# Patient Record
Sex: Female | Born: 2005 | Race: White | Hispanic: No | Marital: Single
Health system: Southern US, Community
[De-identification: ages and names within clinical notes are randomized; demographics above are authoritative.]

---
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
## 2024-01-08 ENCOUNTER — Ambulatory Visit: Payer: Self-pay | Admitting: Psychology

## 2024-01-20 ENCOUNTER — Ambulatory Visit (INDEPENDENT_AMBULATORY_CARE_PROVIDER_SITE_OTHER): Payer: Self-pay | Admitting: Psychology

## 2024-01-20 DIAGNOSIS — F4010 Social phobia, unspecified: Secondary | ICD-10-CM

## 2024-01-20 NOTE — Progress Notes (Signed)
 [facility] Counselor/Therapist Progress Note  Patient ID: Ree, Alcalde MRN: [id number]    Date: 01/20/2024  Time Spent: 8:02am- 8:48am   Treatment Type: Individual Therapy  pt is seen for a virtual video visit via caregility.  Pt joins from his home and counselor from her office.  Pt consents to virtual visit and is aware of limitations of such visits.   Reported Symptoms: Pt reported enjoying interactions w/ friends. Pt reports anxiety about change and leaving friends.  Mental Status Exam: Appearance:  Well Groomed     Behavior: Appropriate  Motor: Normal  Speech/Language:  Normal Rate  Affect: Appropriate  Mood: anxious  Thought process: normal  Thought content:   WNL  Sensory/Perceptual disturbances:   WNL  Orientation: oriented to person, place, time/date, and situation  Attention: Good  Concentration: Good  Memory: WNL  Fund of knowledge:  Good  Insight:   Good  Judgment:  Good  Impulse Control: Good   Risk Assessment: Danger to Self:  No Self-injurious Behavior: No Danger to Others: No Duty to Warn:no Physical Aggression / Violence:No  Access to Firearms a concern: No  Gang Involvement:No   Subjective: counselor assessed pt current functioning per pt report. Processed w/ pt positives, stressors and anxiety.  Explored guilt feelings re: past interactions w/ friend and their hurt feelings. Discussed anxiety re; graduation and change and assisted w/ reframing distortions.  pt affect wnl. Pt discussed some struggles w guild and anxiety about future changes.   Pt was able to acknowledge intentions weren't to hurt and that boundaries set for positive intentions.  Pt discussed anxiety about graduated and going to college and "starting over".  Pt discussed comfort w/ friends established in hight school.  Pt able to challenge and reframe w/ counselor assistance.     Interventions: Cognitive Behavioral Therapy, Psychologist, occupational, and  Solution-Oriented/Positive Psychology  Diagnosis:Social anxiety disorder  Plan: Pt to f/u w/ counseling in 2 weeks to assist coping w/ anxiety.    Individualized Treatment Plan Strengths: enjoys art and music.  Self taught guitar.  Enjoys hanging w/ friends.  Graduating high school this year and planning for college.    Supports: 2 cousins, bestfriend- [place] and [place], and parents.      Goal/Needs for Treatment:  In order of importance to patient 1) increase self confidence/assertiveness 2) decreased anxiety 3) cope w/ transition post high school    Client Statement of Needs: Pt: " I want to continue working on anxiety,  speak up for who I want to be and what I want in the future, I want to be able to tell people when I don't like what they are doing/ when misgender.  I need to think for myself more and not for someone else's opinion. Increase confidence in self"  Pt also wanting support through transition of graduating and starting college.            Treatment Level:outpatient counseling  Symptoms:social anxiety, low self confidence, difficulty being assertive.  Client Treatment Preferences:biweekly counseling.  Prefers in person when family schedule allows.      Healthcare consumer's goal for treatment:   Counselor, Forde Radon, [facility] will support the patient's ability to achieve the goals identified. Cognitive Behavioral Therapy, Assertive Communication/Conflict Resolution Training, Relaxation Training, ACT, Humanistic and other evidenced-based practices will be used to promote progress towards healthy functioning.    Healthcare consumer will: Actively participate in therapy, working towards healthy functioning.     *  Justification for Continuation/Discontinuation of Goal: R=Revised, O=Ongoing, A=Achieved, D=Discontinued   Goal 1) Build a consistently positive self-image- being able to identify and verbalize own thoughts/feelings, increase encouraging self and increased assertive  communication per pt report and therapist observation. Baseline date 09/18/23: Progress towards goal 40; How Often - Daily Target Date Goal Was reviewed Status Code Progress towards goal/Likert rating  09/17/24                            Goal 2) Decrease anxiety/shyness and fear of judgement in social settings/interactions through use of relaxation skills and challenging distortions AEB pt report and therapist observation.  Baseline date 09/18/23: Progress towards goal 60; How Often - Daily Target Date Goal Was reviewed Status Code Progress towards goal  09/17/24                            Goal 3) Increase pt confidence in transition from high school to post graduation by exploring options, identifying plan and implementing per pt report and therapist observation..   Baseline date: 09/18/23  Progress towards goal 0; How Often - Daily Target Date Goal Was reviewed Status Code Progress towards goal  09/17/24                            This plan has been reviewed and created by the following participants:  This plan will be reviewed at least every 12 months. Date Behavioral Health Clinician Date Guardian/Patient   09/18/23 [facility] Ophelia Charter [facility] 09/18/23 Verbal Consent Provided                                    Forde Radon [facility]

## 2024-02-17 ENCOUNTER — Ambulatory Visit: Payer: Self-pay | Admitting: Psychology

## 2024-02-17 DIAGNOSIS — F4010 Social phobia, unspecified: Secondary | ICD-10-CM | POA: Diagnosis not present

## 2024-02-17 NOTE — Progress Notes (Signed)
 [facility] Counselor/Therapist Progress Note  Patient ID: Pamela Lester, Chinn MRN: [id number]    Date: 02/17/2024  Time Spent: 8:02am- 8:48am   Treatment Type: Individual Therapy  pt is seen for a virtual video visit via caregility.  Pt joins from his home and counselor from her home office.  Pt consents to virtual visit and is aware of limitations of such visits.   Reported Symptoms: Pt reported enjoying interactions w/ friends. Pt reports anxious about sharing that reconnected w/ ex  Mental Status Exam: Appearance:  Well Groomed     Behavior: Appropriate and slightly guarded  Motor: Normal  Speech/Language:  Normal Rate  Affect: Appropriate  Mood: anxious  Thought process: normal  Thought content:   WNL  Sensory/Perceptual disturbances:   WNL  Orientation: oriented to person, place, time/date, and situation  Attention: Good  Concentration: Good  Memory: WNL  Fund of knowledge:  Good  Insight:   Good  Judgment:  Good  Impulse Control: Good   Risk Assessment: Danger to Self:  No Self-injurious Behavior: No Danger to Others: No Duty to Warn:no Physical Aggression / Violence:No  Access to Firearms a concern: No  Gang Involvement:No   Subjective: counselor assessed pt current functioning per pt report. Processed w/ pt positives, stressors and worries. Explored feelings w/ friend leaving work and how can feel sadness and also experience other emotions.  Discussed reconnecting w/ ex and awareness of past patterns and what is signs of healthy vs. Unhealthy relationship- what boundaries to keep.   pt affect congruent w/ emotions.  Pt slightly guarded.  Pt reports that she is enjoying time w/ friends and engaging w/ friends at work and school.  Pt reports sadness that close friend just left work.  Pt recognized that other thing still positive about work and connections and can maintain contact in other ways.  Pt was anxious to share and disclose about  talking ot ex again for a week.  Pt reports that aware of unhealthy relationship/patterns and why loved ones would be concerned.  Pt doesn't want to become codependent in relationship again and will consider boundaries needs to maintain.    Interventions: Cognitive Behavioral Therapy, Psychologist, occupational, and Solution-Oriented/Positive Psychology  Diagnosis:Social anxiety disorder  Plan: Pt to f/u w/ counseling in 2 weeks to assist coping w/ anxiety.    Individualized Treatment Plan Strengths: enjoys art and music.  Self taught guitar.  Enjoys hanging w/ friends.  Graduating high school this year and planning for college.    Supports: 2 cousins, bestfriend- [place] and [place], and parents.      Goal/Needs for Treatment:  In order of importance to patient 1) increase self confidence/assertiveness 2) decreased anxiety 3) cope w/ transition post high school    Client Statement of Needs: Pt: " I want to continue working on anxiety,  speak up for who I want to be and what I want in the future, I want to be able to tell people when I don't like what they are doing/ when misgender.  I need to think for myself more and not for someone else's opinion. Increase confidence in self"  Pt also wanting support through transition of graduating and starting college.            Treatment Level:outpatient counseling  Symptoms:social anxiety, low self confidence, difficulty being assertive.  Client Treatment Preferences:biweekly counseling.  Prefers in person when family schedule allows.      Healthcare consumer's goal for  treatment:   Counselor, Forde Radon, [facility] will support the patient's ability to achieve the goals identified. Cognitive Behavioral Therapy, Assertive Communication/Conflict Resolution Training, Relaxation Training, ACT, Humanistic and other evidenced-based practices will be used to promote progress towards healthy functioning.    Healthcare consumer will: Actively participate in  therapy, working towards healthy functioning.     *Justification for Continuation/Discontinuation of Goal: R=Revised, O=Ongoing, A=Achieved, D=Discontinued   Goal 1) Build a consistently positive self-image- being able to identify and verbalize own thoughts/feelings, increase encouraging self and increased assertive communication per pt report and therapist observation. Baseline date 09/18/23: Progress towards goal 40; How Often - Daily Target Date Goal Was reviewed Status Code Progress towards goal/Likert rating  09/17/24                            Goal 2) Decrease anxiety/shyness and fear of judgement in social settings/interactions through use of relaxation skills and challenging distortions AEB pt report and therapist observation.  Baseline date 09/18/23: Progress towards goal 60; How Often - Daily Target Date Goal Was reviewed Status Code Progress towards goal  09/17/24                            Goal 3) Increase pt confidence in transition from high school to post graduation by exploring options, identifying plan and implementing per pt report and therapist observation..   Baseline date: 09/18/23  Progress towards goal 0; How Often - Daily Target Date Goal Was reviewed Status Code Progress towards goal  09/17/24                            This plan has been reviewed and created by the following participants:  This plan will be reviewed at least every 12 months. Date Behavioral Health Clinician Date Guardian/Patient   09/18/23 [facility] Ophelia Charter [facility] 09/18/23 Verbal Consent Provided                                      Forde Radon [facility]
